# Patient Record
Sex: Male | Born: 2000 | Race: Black or African American | Hispanic: No | Marital: Single | Smoking: Never smoker
Health system: Southern US, Community
[De-identification: ages and names within clinical notes are randomized; demographics above are authoritative.]

## PROBLEM LIST (undated history)

## (undated) DIAGNOSIS — G43909 Migraine, unspecified, not intractable, without status migrainosus: Secondary | ICD-10-CM

## (undated) HISTORY — PX: HAND SURGERY: SHX662

---
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
## 2019-05-09 IMAGING — CT CT MAXILLOFACIAL W/O CM
3 of 6 series · 16 of 47 positions shown, 19 images · non-contrast
Comparison: None.

CLINICAL DATA: The patient ran into a wall today while playing
basketball. Mandibular pain. Initial encounter.

EXAM:
CT MAXILLOFACIAL WITHOUT CONTRAST
TECHNIQUE: Multidetector CT imaging of the maxillofacial structures was
performed. Multiplanar CT image reconstructions were also generated.

[Series 3: maxilllofacial 2.0 hr40 3 · axial · 0.40mm/px · z∈[-259,-97]mm · 11 of 95 slices shown, 14 images]
[im 7/95  brain]
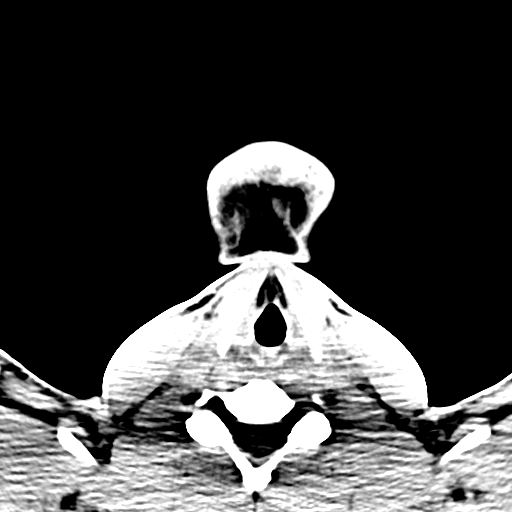
[im 7/95  bone]
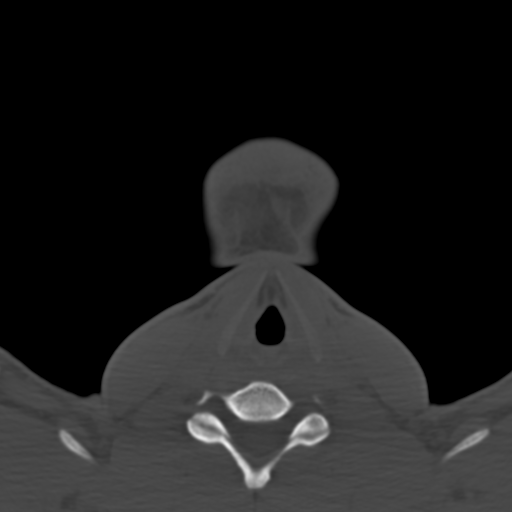
[im 14/95  bone]
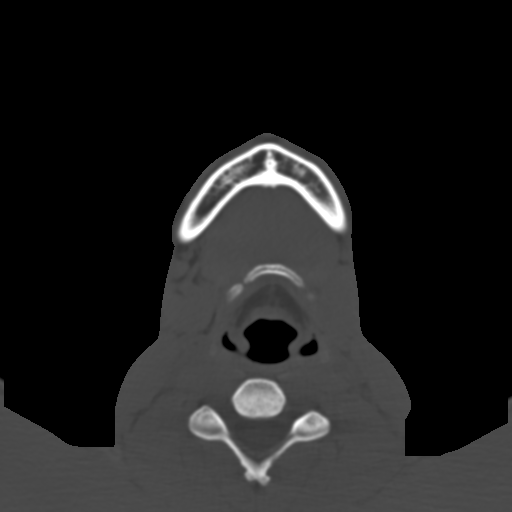
[im 21/95  bone]
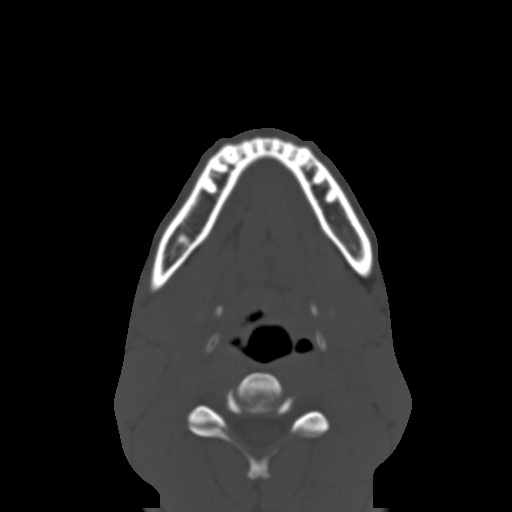
[im 34/95  bone]
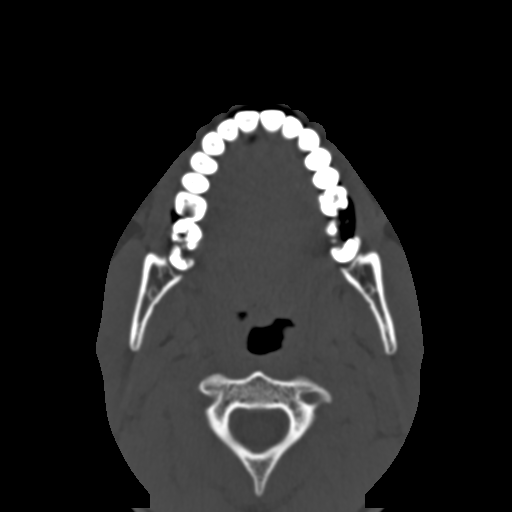
[im 41/95  brain]
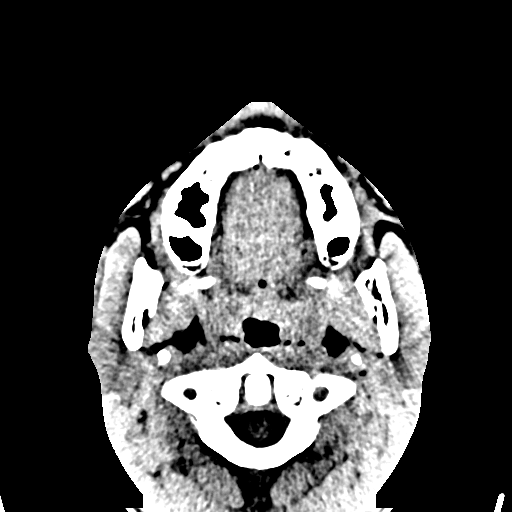
[im 41/95  bone]
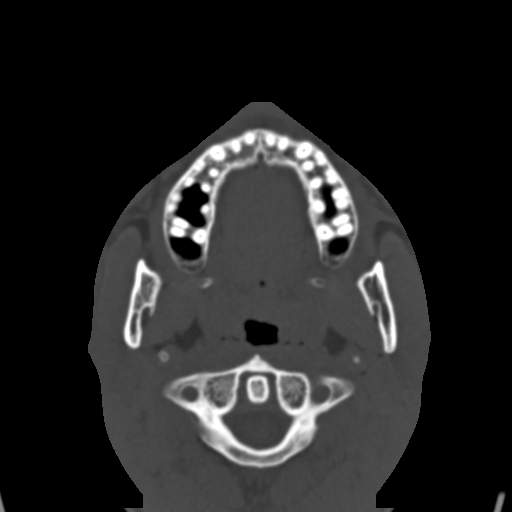
[im 48/95  bone]
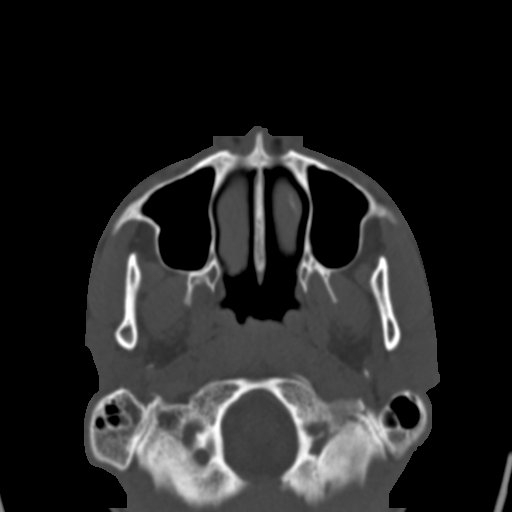
[im 54/95  bone]
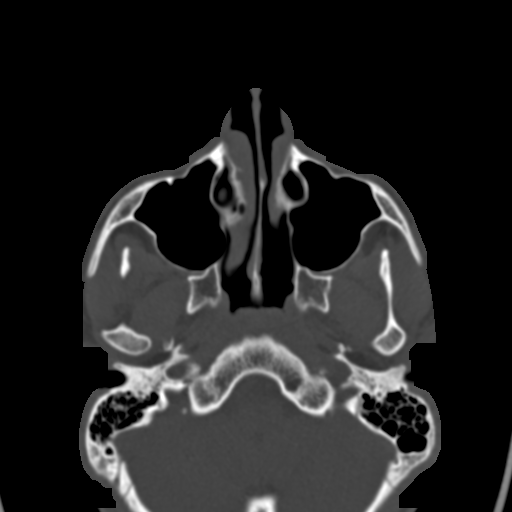
[im 61/95  bone]
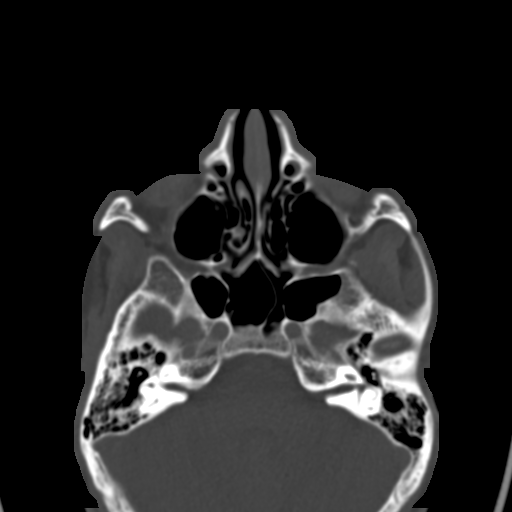
[im 74/95  brain]
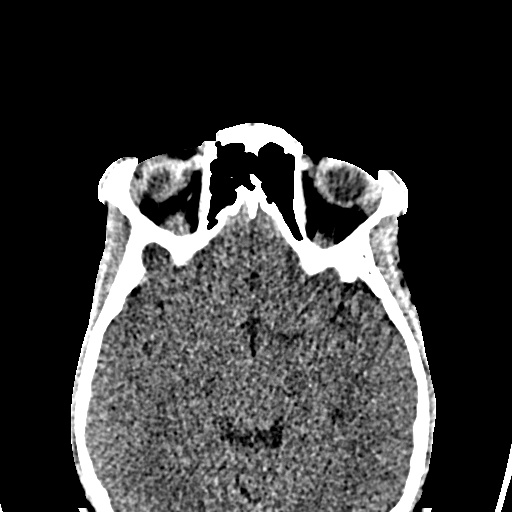
[im 74/95  bone]
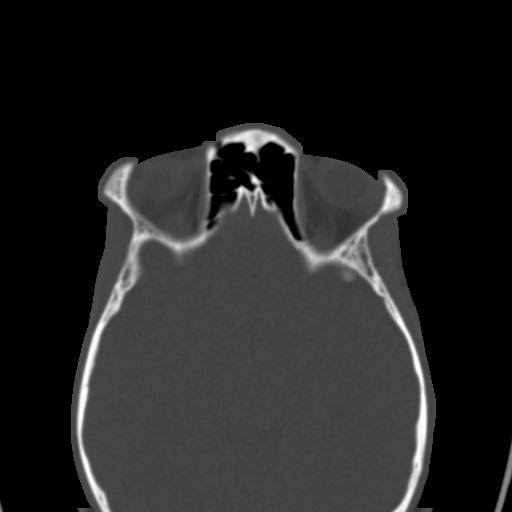
[im 81/95  bone]
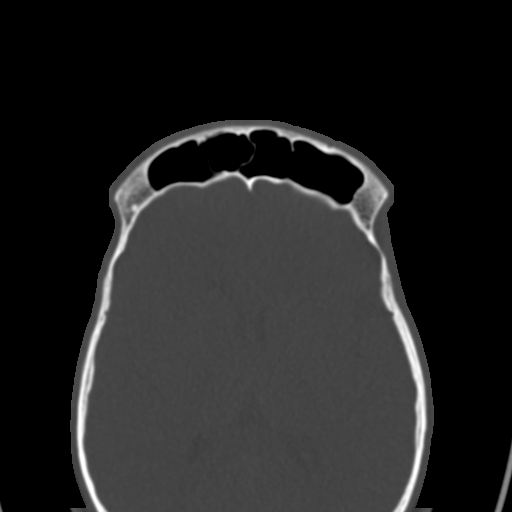
[im 88/95  bone]
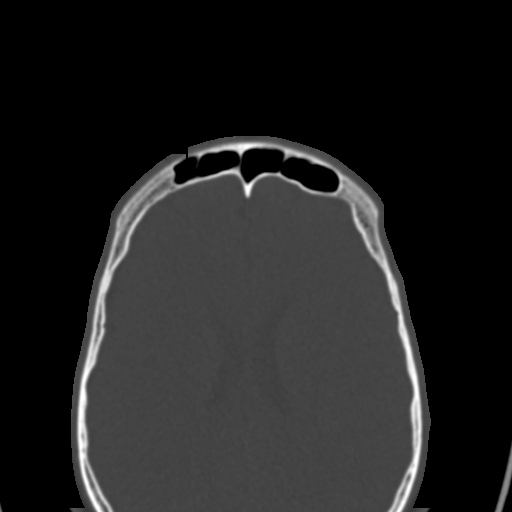

[Series 9: bone cor · coronal · 0.39mm/px · 3 of 87 slices shown]
[im 22/87  bone]
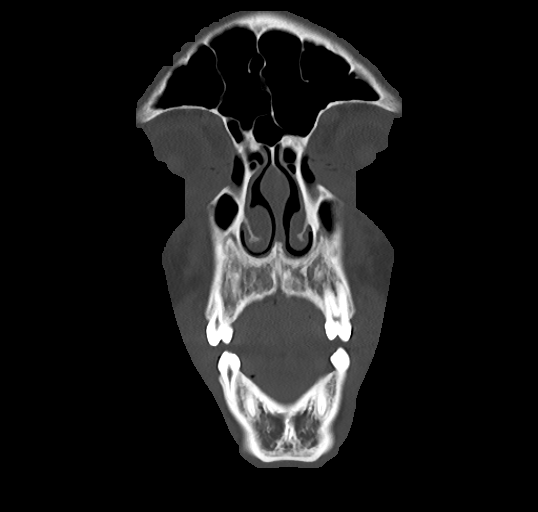
[im 44/87  bone]
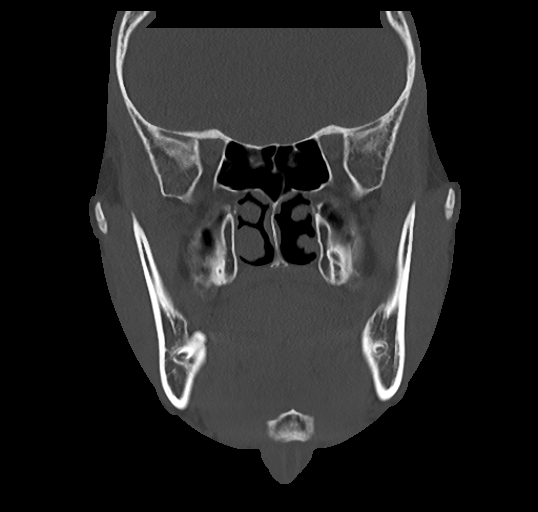
[im 65/87  bone]
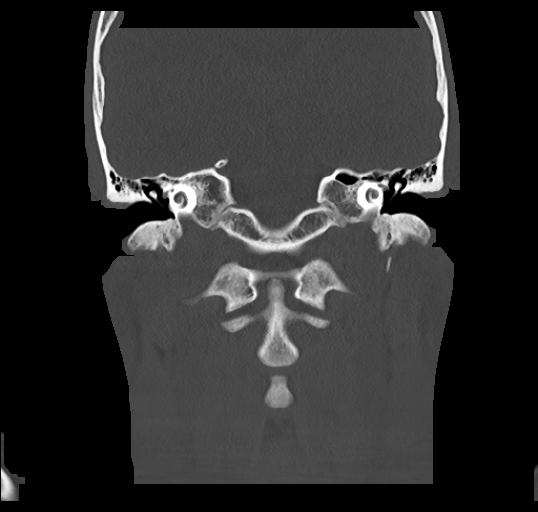

[Series 12: bone sag · sagittal · 0.37mm/px · 2 of 94 slices shown]
[im 32/94  bone]
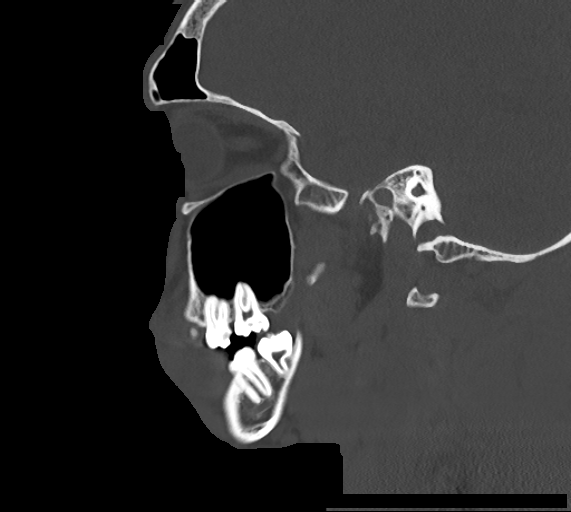
[im 63/94  bone]
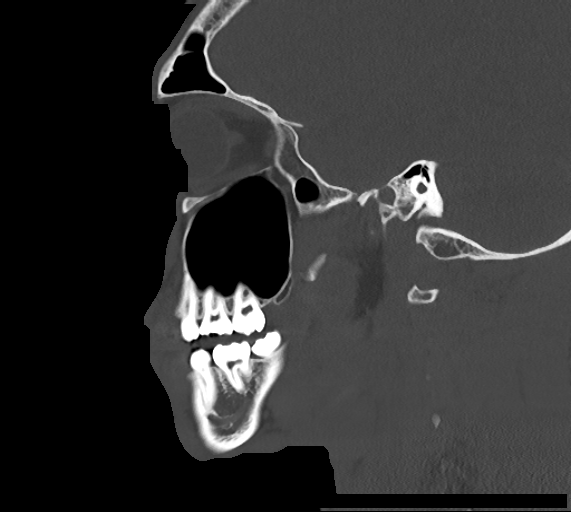

[16 of 47 positions shown; findings below may reference images not displayed]

FINDINGS: Osseous: No fracture or mandibular dislocation. No destructive
process.

Orbits: Negative. No traumatic or inflammatory finding.

Sinuses: Clear.

Soft tissues: Negative.

Limited intracranial: Negative.

Other: A large appearing cavity in an upper left molar is noted. No
evidence of periapical abscess.
IMPRESSION: No acute abnormality.

Dental disease with a cavity and upper left molar.

## 2021-03-30 ENCOUNTER — Other Ambulatory Visit: Payer: Self-pay

## 2021-03-30 ENCOUNTER — Encounter (HOSPITAL_BASED_OUTPATIENT_CLINIC_OR_DEPARTMENT_OTHER): Payer: Self-pay | Admitting: Emergency Medicine

## 2021-03-30 ENCOUNTER — Emergency Department (HOSPITAL_BASED_OUTPATIENT_CLINIC_OR_DEPARTMENT_OTHER)
Admission: EM | Admit: 2021-03-30 | Discharge: 2021-03-30 | Disposition: A | Discharge status: Home / Self Care | Payer: No Typology Code available for payment source | Attending: Emergency Medicine | Admitting: Emergency Medicine

## 2021-03-30 DIAGNOSIS — Z20822 Contact with and (suspected) exposure to covid-19: Secondary | ICD-10-CM | POA: Insufficient documentation

## 2021-03-30 DIAGNOSIS — J029 Acute pharyngitis, unspecified: Secondary | ICD-10-CM | POA: Insufficient documentation

## 2021-03-30 LAB — GROUP A STREP BY PCR: Group A Strep by PCR: NOT DETECTED

## 2021-03-30 LAB — RESP PANEL BY RT-PCR (FLU A&B, COVID) ARPGX2
Influenza A by PCR: NEGATIVE
Influenza B by PCR: NEGATIVE
SARS Coronavirus 2 by RT PCR: NEGATIVE

## 2021-03-30 LAB — MONONUCLEOSIS SCREEN: Mono Screen: NEGATIVE

## 2021-03-30 MED ORDER — DOXYCYCLINE HYCLATE 100 MG PO CAPS: 100.0000 mg | ORAL_CAPSULE | Freq: Two times a day (BID) | ORAL | 0 refills | Status: AC

## 2021-03-30 MED ORDER — DOXYCYCLINE HYCLATE 100 MG PO TABS
100.0000 mg | ORAL_TABLET | Freq: Once | ORAL | Status: AC
Start: 2021-03-30 — End: 2021-03-30
  Administered 2021-03-30: 100 mg via ORAL
  Filled 2021-03-30: qty 1

## 2021-03-30 MED ORDER — LIDOCAINE HCL (PF) 1 % IJ SOLN
INTRAMUSCULAR | Status: AC
  Administered 2021-03-30: 1 mL
  Filled 2021-03-30: qty 5

## 2021-03-30 MED ORDER — DEXAMETHASONE 4 MG PO TABS
10.0000 mg | ORAL_TABLET | Freq: Once | ORAL | Status: AC
  Administered 2021-03-30: 10 mg via ORAL
  Filled 2021-03-30: qty 3

## 2021-03-30 MED ORDER — CEFTRIAXONE SODIUM 500 MG IJ SOLR
500.0000 mg | Freq: Once | INTRAMUSCULAR | Status: AC
  Administered 2021-03-30: 500 mg via INTRAMUSCULAR
  Filled 2021-03-30: qty 500

## 2021-03-30 NOTE — ED Provider Notes (Signed)
[facility] EMERGENCY DEPT Provider Note   CSN: [id number] Arrival date & time: 03/30/21  1311     History Chief Complaint  Patient presents with   Sore Throat    Leonard Gill is a 20 y.o. male.   Sore Throat This is a chronic problem. The current episode started more than 1 week ago (4 weeks ago). The problem occurs constantly. The problem has not changed since onset.Pertinent negatives include no chest pain, no abdominal pain, no headaches and no shortness of breath. Nothing aggravates the symptoms. Nothing relieves the symptoms.   20 year old otherwise healthy male who endorses a sore throat with associated difficulty swallowing for the past month.  He states that he has had a sharp, shooting pain with no radiation and associated throat swelling and difficulty swallowing for the past 4 weeks.  Nothing has made it better or worse.  He denies a history of mono.  He denies any recent sick contacts.  He denies any unilateral throat swelling.  He denies any neck pain or stiffness.  He states that he is currently sexually active and has had oral intercourse with another male in the past month.  Past Medical History:  Diagnosis Date   Migraines     There are no problems to display for this patient.   History reviewed. No pertinent surgical history.     No family history on file.  Social History   Tobacco Use   Smoking status: Never   Smokeless tobacco: Never  Substance Use Topics   Alcohol use: No    Home Medications Prior to Admission medications   Medication Sig Start Date End Date Taking? Authorizing Provider  doxycycline (VIBRAMYCIN) 100 MG capsule Take 1 capsule (100 mg total) by mouth 2 (two) times daily for 7 days. 03/30/21 04/06/21 Yes Ernie Avena, MD    Allergies    Patient has no known allergies.  Review of Systems   Review of Systems  HENT:  Positive for sore throat and trouble swallowing.   Respiratory:  Negative for shortness of  breath.   Cardiovascular:  Negative for chest pain.  Gastrointestinal:  Negative for abdominal pain.  Neurological:  Negative for headaches.  All other systems reviewed and are negative.  Physical Exam Updated Vital Signs BP 129/79 (BP Location: Right Arm)   Pulse (!) 53   Temp 98.8 F (37.1 C) (Oral)   Resp 18   Ht 6\' 5"  (1.956 m)   Wt 81.6 kg   SpO2 97%   BMI 21.34 kg/m   Physical Exam Vitals and nursing note reviewed.  Constitutional:      General: He is not in acute distress. HENT:     Head: Normocephalic and atraumatic.     Nose: No congestion or rhinorrhea.     Mouth/Throat:     Mouth: Mucous membranes are moist. No oral lesions.     Pharynx: Uvula midline. No pharyngeal swelling.     Tonsils: Tonsillar exudate present. No tonsillar abscesses. 1+ on the right. 1+ on the left.     Comments: Oropharyngeal erythema bilaterally with tonsillar exudate present.  No clear asymmetry to suggest PTA.  Good range of motion of the neck  Eyes:     Conjunctiva/sclera: Conjunctivae normal.     Pupils: Pupils are equal, round, and reactive to light.  Cardiovascular:     Rate and Rhythm: Normal rate and regular rhythm.  Pulmonary:     Effort: Pulmonary effort is normal. No respiratory distress.  Abdominal:  General: There is no distension.     Tenderness: There is no guarding.  Musculoskeletal:        General: No deformity or signs of injury.     Cervical back: Normal range of motion and neck supple.  Skin:    Findings: No lesion or rash.  Neurological:     General: No focal deficit present.     Mental Status: He is alert. Mental status is at baseline.    ED Results / Procedures / Treatments   Labs (all labs ordered are listed, but only abnormal results are displayed) Labs Reviewed  GROUP A STREP BY PCR  RESP PANEL BY RT-PCR (FLU A&B, COVID) ARPGX2  MONONUCLEOSIS SCREEN  GC/CHLAMYDIA PROBE AMP () NOT AT [facility]    EKG None  Radiology No results  found.  Procedures Procedures   Medications Ordered in ED Medications  cefTRIAXone (ROCEPHIN) injection 500 mg (has no administration in time range)  doxycycline (VIBRA-TABS) tablet 100 mg (has no administration in time range)  dexamethasone (DECADRON) tablet 10 mg (10 mg Oral Given 03/30/21 1554)    ED Course  I have reviewed the triage vital signs and the nursing notes.  Pertinent labs & imaging results that were available during my care of the patient were reviewed by me and considered in my medical decision making (see chart for details).    MDM Rules/Calculators/A&P                           20 year old male presenting to the emergency department with roughly 4 weeks of sore throat.  on arrival, the patient was afebrile, hemodynamically stable, bilateral oropharyngeal erythema with bilateral tonsillar swelling and exudate without clear evidence of PTA.  Differential diagnosis includes strep throat, infectious mononucleosis, oropharyngeal gonococcal infection, less likely RPA, PTA, Ludwig's angina based on physical exam findings.  The patient had a mononucleosis screen which was negative.  Group A strep by PCR was also not detected.  A COVID-19 and influenza swab was collected and pending.  GC/chlamydia swab of the throat was also collected and pending.  Patient was administered Decadron tablet 10 mg for his oropharyngeal swelling.  Given the duration of the patient's pharyngitis of 1 month, negative mononucleosis screen, recent sexual activity to include oral sex, we will go ahead and treat empirically for GC/chlamydia here in the ED while GC swab is pending.  Patient was administered 500 mg of IM Rocephin and was started on doxycycline twice daily for 7 days.  He was overall tolerating oral intake and deemed stable for discharge.  Final Clinical Impression(s) / ED Diagnoses Final diagnoses:  Pharyngitis, unspecified etiology    Rx / DC Orders ED Discharge Orders           Ordered    doxycycline (VIBRAMYCIN) 100 MG capsule  2 times daily        03/30/21 1632             Ernie Avena, MD 03/30/21 [phone]

## 2021-03-30 NOTE — Discharge Instructions (Signed)
Your COVID-19 and influenza PCR tests are pending and will be available on our patient portal.  Your GC and Chlamydia testing will also be available on our patient portal.  In the meantime, we will empirically treat you for possible gonorrhea/chlamydia of the throat.  You were administered intramuscular antibiotics here and will be started on a short course of doxycycline.

## 2021-03-30 NOTE — ED Triage Notes (Signed)
Reports sore throat with difficulty swallowing for a month.  Also endorses foul breathe.

## 2021-03-31 LAB — GC/CHLAMYDIA PROBE AMP (~~LOC~~) NOT AT ARMC
Chlamydia: NEGATIVE
Comment: NEGATIVE
Comment: NORMAL
Neisseria Gonorrhea: NEGATIVE

## 2021-04-11 ENCOUNTER — Encounter (HOSPITAL_BASED_OUTPATIENT_CLINIC_OR_DEPARTMENT_OTHER): Payer: Self-pay | Admitting: *Deleted

## 2021-04-11 ENCOUNTER — Emergency Department (HOSPITAL_BASED_OUTPATIENT_CLINIC_OR_DEPARTMENT_OTHER)
Admission: EM | Admit: 2021-04-11 | Discharge: 2021-04-11 | Disposition: A | Discharge status: Home / Self Care | Payer: Self-pay | Attending: Emergency Medicine | Admitting: Emergency Medicine

## 2021-04-11 ENCOUNTER — Other Ambulatory Visit: Payer: Self-pay

## 2021-04-11 DIAGNOSIS — M791 Myalgia, unspecified site: Secondary | ICD-10-CM | POA: Insufficient documentation

## 2021-04-11 DIAGNOSIS — A084 Viral intestinal infection, unspecified: Secondary | ICD-10-CM | POA: Insufficient documentation

## 2021-04-11 DIAGNOSIS — Z20822 Contact with and (suspected) exposure to covid-19: Secondary | ICD-10-CM | POA: Insufficient documentation

## 2021-04-11 LAB — COMPREHENSIVE METABOLIC PANEL
ALT: 25 U/L (ref 0–44)
AST: 36 U/L (ref 15–41)
Albumin: 4.7 g/dL (ref 3.5–5.0)
Alkaline Phosphatase: 50 U/L (ref 38–126)
Anion gap: 8 (ref 5–15)
BUN: 14 mg/dL (ref 6–20)
CO2: 29 mmol/L (ref 22–32)
Calcium: 9.5 mg/dL (ref 8.9–10.3)
Chloride: 105 mmol/L (ref 98–111)
Creatinine, Ser: 1.2 mg/dL (ref 0.61–1.24)
GFR, Estimated: >60 mL/min (ref >60)
Glucose, Bld: 114 mg/dL — ABNORMAL HIGH (ref 70–99)
Potassium: 4.1 mmol/L (ref 3.5–5.1)
Sodium: 142 mmol/L (ref 135–145)
Total Bilirubin: 0.6 mg/dL (ref 0.3–1.2)
Total Protein: 7.8 g/dL (ref 6.5–8.1)

## 2021-04-11 LAB — URINALYSIS, ROUTINE W REFLEX MICROSCOPIC
Bilirubin Urine: NEGATIVE
Glucose, UA: NEGATIVE mg/dL
Hgb urine dipstick: NEGATIVE
Ketones, ur: NEGATIVE mg/dL
Leukocytes,Ua: NEGATIVE
Nitrite: NEGATIVE
Protein, ur: 30 mg/dL — AB
Specific Gravity, Urine: 1.034 — ABNORMAL HIGH (ref 1.005–1.030)
pH: 7 (ref 5.0–8.0)

## 2021-04-11 LAB — RESP PANEL BY RT-PCR (FLU A&B, COVID) ARPGX2
Influenza A by PCR: NEGATIVE
Influenza B by PCR: NEGATIVE
SARS Coronavirus 2 by RT PCR: NEGATIVE

## 2021-04-11 LAB — CBC
HCT: 44.9 % (ref 39.0–52.0)
Hemoglobin: 14.7 g/dL (ref 13.0–17.0)
MCH: 28.4 pg (ref 26.0–34.0)
MCHC: 32.7 g/dL (ref 30.0–36.0)
MCV: 86.7 fL (ref 80.0–100.0)
Platelets: 133 10*3/uL — ABNORMAL LOW (ref 150–400)
RBC: 5.18 MIL/uL (ref 4.22–5.81)
RDW: 12.7 % (ref 11.5–15.5)
WBC: 10.6 10*3/uL — ABNORMAL HIGH (ref 4.0–10.5)
nRBC: 0 % (ref 0.0–0.2)

## 2021-04-11 LAB — LIPASE, BLOOD: Lipase: 19 U/L (ref 11–51)

## 2021-04-11 MED ORDER — ONDANSETRON 4 MG PO TBDP
4.0000 mg | ORAL_TABLET | Freq: Once | ORAL | Status: AC | PRN
  Administered 2021-04-11: 4 mg via ORAL
  Filled 2021-04-11: qty 1

## 2021-04-11 MED ORDER — METOCLOPRAMIDE HCL 5 MG/ML IJ SOLN
10.0000 mg | Freq: Once | INTRAMUSCULAR | Status: AC
  Administered 2021-04-11: 10 mg via INTRAVENOUS
  Filled 2021-04-11: qty 2

## 2021-04-11 MED ORDER — ONDANSETRON 4 MG PO TBDP: 4.0000 mg | ORAL_TABLET | Freq: Three times a day (TID) | ORAL | 0 refills | Status: DC | PRN

## 2021-04-11 MED ORDER — SODIUM CHLORIDE 0.9 % IV BOLUS
1000.0000 mL | Freq: Once | INTRAVENOUS | Status: AC
  Administered 2021-04-11: 1000 mL via INTRAVENOUS

## 2021-04-11 MED ORDER — METOCLOPRAMIDE HCL 10 MG PO TABS: 10.0000 mg | ORAL_TABLET | Freq: Four times a day (QID) | ORAL | 0 refills | Status: DC

## 2021-04-11 MED ORDER — ONDANSETRON HCL 4 MG/2ML IJ SOLN
4.0000 mg | Freq: Once | INTRAMUSCULAR | Status: AC
  Administered 2021-04-11: 4 mg via INTRAVENOUS
  Filled 2021-04-11: qty 2

## 2021-04-11 NOTE — ED Triage Notes (Signed)
Pt states he has been vomiting this morning, dry heaves now, reports old looking blood in emesis. Generally just feels bad.

## 2021-04-11 NOTE — ED Notes (Signed)
RT Note: Pt. placed back in lobby with mother per RN request, labels left at Triage.

## 2021-04-11 NOTE — ED Notes (Signed)
Pt provided water, per his request.

## 2021-04-11 NOTE — ED Provider Notes (Signed)
[place] EMERGENCY DEPT Provider Note   CSN: [id number] Arrival date & time: 04/11/21  [id number]     History Chief Complaint  Patient presents with   Emesis   Nausea    Leonard Gill is a 20 y.o. male with no significant past medical history who presents to the emergency department with abdominal pain and vomiting since 6 AM this morning.  Patient states he had several episodes of blood-streaked emesis, nonbloody diarrhea, productive cough, headache, and body aches.  Is also complaining of generalized abdominal pain, no aggravating or alleviating factors.  Mother bedside states that he has been breaking out in cold sweats, but she has not checked his temperature.  No one else at home with similar symptoms.  They have not tried anything for the symptoms.   Emesis Associated symptoms: abdominal pain, chills, cough, diarrhea, headaches and myalgias       Past Medical History:  Diagnosis Date   Migraines     There are no problems to display for this patient.   Past Surgical History:  Procedure Laterality Date   HAND SURGERY Right        No family history on file.  Social History   Tobacco Use   Smoking status: Never   Smokeless tobacco: Never  Vaping Use   Vaping Use: Never used  Substance Use Topics   Alcohol use: No   Drug use: Never    Home Medications Prior to Admission medications   Medication Sig Start Date End Date Taking? Authorizing Provider  metoCLOPramide (REGLAN) 10 MG tablet Take 1 tablet (10 mg total) by mouth every 6 (six) hours. 04/11/21  Yes Modena Bellemare T, PA-C  ondansetron (ZOFRAN ODT) 4 MG disintegrating tablet Take 1 tablet (4 mg total) by mouth every 8 (eight) hours as needed for nausea or vomiting. 04/11/21  Yes Amariyana Heacox T, PA-C    Allergies    Patient has no known allergies.  Review of Systems   Review of Systems  Constitutional:  Positive for chills.  HENT:  Positive for congestion.   Respiratory:  Positive  for cough. Negative for shortness of breath.   Cardiovascular:  Negative for chest pain.  Gastrointestinal:  Positive for abdominal pain, diarrhea, nausea and vomiting. Negative for blood in stool and constipation.  Musculoskeletal:  Positive for myalgias.  Neurological:  Positive for headaches.  All other systems reviewed and are negative.  Physical Exam Updated Vital Signs BP 127/76 (BP Location: Right Arm)   Pulse 87   Temp 98 F (36.7 C) (Oral)   Resp 16   Ht 6\' 5"  (1.956 m)   Wt 81.6 kg   SpO2 100%   BMI 21.34 kg/m   Physical Exam Vitals and nursing note reviewed.  Constitutional:      Appearance: Normal appearance.  HENT:     Head: Normocephalic and atraumatic.  Eyes:     Conjunctiva/sclera: Conjunctivae normal.  Cardiovascular:     Rate and Rhythm: Normal rate and regular rhythm.  Pulmonary:     Effort: Pulmonary effort is normal. No respiratory distress.     Breath sounds: Normal breath sounds.  Abdominal:     General: There is no distension.     Palpations: Abdomen is soft.     Tenderness: There is no abdominal tenderness.  Skin:    General: Skin is warm and dry.  Neurological:     General: No focal deficit present.     Mental Status: He is alert.    ED  Results / Procedures / Treatments   Labs (all labs ordered are listed, but only abnormal results are displayed) Labs Reviewed  COMPREHENSIVE METABOLIC PANEL - Abnormal; Notable for the following components:      Result Value   Glucose, Bld 114 (*)    All other components within normal limits  CBC - Abnormal; Notable for the following components:   WBC 10.6 (*)    Platelets 133 (*)    All other components within normal limits  URINALYSIS, ROUTINE W REFLEX MICROSCOPIC - Abnormal; Notable for the following components:   Specific Gravity, Urine 1.034 (*)    Protein, ur 30 (*)    All other components within normal limits  RESP PANEL BY RT-PCR (FLU A&B, COVID) ARPGX2  LIPASE, BLOOD     EKG None  Radiology No results found.  Procedures Procedures   Medications Ordered in ED Medications  ondansetron (ZOFRAN-ODT) disintegrating tablet 4 mg (4 mg Oral Given 04/11/21 1018)  sodium chloride 0.9 % bolus 1,000 mL (0 mLs Intravenous Stopped 04/11/21 1339)  ondansetron (ZOFRAN) injection 4 mg (4 mg Intravenous Given 04/11/21 1417)  metoCLOPramide (REGLAN) injection 10 mg (10 mg Intravenous Given 04/11/21 1508)    ED Course  I have reviewed the triage vital signs and the nursing notes.  Pertinent labs & imaging results that were available during my care of the patient were reviewed by me and considered in my medical decision making (see chart for details).    MDM Rules/Calculators/A&P                           Patient is a 20 year old male who presents to the emergency department for 1 day of abdominal pain and vomiting.  On exam patient is afebrile, not tachycardic, and in no acute distress.  Abdomen soft, nontender nondistended.  CBC and CMP unremarkable.  Lipase normal.  COVID and flu testing negative.  Urinalysis negative for hematuria or urinary tract infection.  Patient stated nausea initially improved with Zofran tablet in triage.  On reevaluation patient states that his symptoms improved after fluids, Zofran, and Reglan.  Passed PO challenge. Abdominal exam remains nonsurgical. Symptoms likely related to viral illness, plan to treat symptomatically.  He does not require admission or inpatient treatment for symptoms at this time.  Discussed reasons return emergency department.  Patient and mother agreeable to plan.  Final Clinical Impression(s) / ED Diagnoses Final diagnoses:  Viral gastroenteritis    Rx / DC Orders ED Discharge Orders          Ordered    ondansetron (ZOFRAN ODT) 4 MG disintegrating tablet  Every 8 hours PRN        04/11/21 1621    metoCLOPramide (REGLAN) 10 MG tablet  Every 6 hours        04/11/21 1621             Jalaiyah Throgmorton,  Rosabella Edgin T, PA-C 04/11/21 1624    Pricilla Loveless, MD 04/12/21 [phone]

## 2021-04-11 NOTE — ED Notes (Signed)
RT Note: Blood work drawn/labelled/sent to [facility].

## 2021-04-11 NOTE — ED Notes (Signed)
Pt reports increased feeling of nausea when standing to walk to bathroom, reports feeling ok when lying in bed. EDP Lorin made aware.

## 2021-04-11 NOTE — Discharge Instructions (Addendum)
You were seen in the emergency department today for abdominal pain and vomiting.  As we discussed I think your symptoms are mostly related to a viral GI infection.  This is normally treated symptomatically with a bland diet, lots of fluids, and over-the-counter medications.  I am prescribing you 2 different nausea medicines, both of which you received in the emergency department.  You can try either one of them to see which you get the best relief with. I've also attached instructions for a bland diet which you should follow for the next several days.  Please use Tylenol or ibuprofen for pain.  You may use 600 mg ibuprofen every 6 hours or 1000 mg of Tylenol every 6 hours.  You may choose to alternate between the 2.  This would be most effective.  Not to exceed 4 g of Tylenol within 24 hours.  Not to exceed 3200 mg ibuprofen in 24 hours.   Continue to monitor how you're doing and return to the ER for new or worsening symptoms such as worsening abdominal pain or fevers.   It has been a pleasure seeing and caring for you today and I hope you start feeling better soon!

## 2022-08-31 ENCOUNTER — Emergency Department (HOSPITAL_COMMUNITY)
Admission: EM | Admit: 2022-08-31 | Discharge: 2022-08-31 | Disposition: A | Discharge status: Home / Self Care | Payer: Medicaid Other | Attending: Emergency Medicine | Admitting: Emergency Medicine

## 2022-08-31 ENCOUNTER — Other Ambulatory Visit: Payer: Self-pay

## 2022-08-31 DIAGNOSIS — J029 Acute pharyngitis, unspecified: Secondary | ICD-10-CM | POA: Diagnosis present

## 2022-08-31 DIAGNOSIS — Z20822 Contact with and (suspected) exposure to covid-19: Secondary | ICD-10-CM | POA: Diagnosis not present

## 2022-08-31 DIAGNOSIS — K029 Dental caries, unspecified: Secondary | ICD-10-CM | POA: Diagnosis not present

## 2022-08-31 LAB — RESP PANEL BY RT-PCR (RSV, FLU A&B, COVID)  RVPGX2
Influenza A by PCR: NEGATIVE
Influenza B by PCR: NEGATIVE
Resp Syncytial Virus by PCR: NEGATIVE
SARS Coronavirus 2 by RT PCR: NEGATIVE

## 2022-08-31 MED ORDER — DEXAMETHASONE 4 MG PO TABS
10.0000 mg | ORAL_TABLET | Freq: Once | ORAL | Status: AC
  Administered 2022-08-31: 10 mg via ORAL
  Filled 2022-08-31: qty 3

## 2022-08-31 NOTE — ED Triage Notes (Signed)
Pt reports ongoing right lower dental pain but new sore throat x 2 days.

## 2022-08-31 NOTE — Discharge Instructions (Signed)
Follow-up with dentist.  I have prescribed you a long-acting steroid to help with your discomfort.  If your strep test is positive I will call in an antibiotic to your pharmacy.

## 2022-08-31 NOTE — ED Provider Notes (Signed)
[facility] Provider Note   CSN: [id number] Arrival date & time: 08/31/22  [id number]     History  Chief Complaint  Patient presents with   Dental Pain   Sore Throat    Leonard Gill is a 22 y.o. male.  Patient here primarily for sore throat that started yesterday.  Denies any fevers or chills.  No difficulty eating or drinking.  Denies any nausea vomiting diarrhea.  He states that he is also concerned about some chipped teeth that he had for a long time.  The history is provided by the patient.       Home Medications Prior to Admission medications   Medication Sig Start Date End Date Taking? Authorizing Provider  metoCLOPramide (REGLAN) 10 MG tablet Take 1 tablet (10 mg total) by mouth every 6 (six) hours. 04/11/21   Roemhildt, Lorin T, PA-C  ondansetron (ZOFRAN ODT) 4 MG disintegrating tablet Take 1 tablet (4 mg total) by mouth every 8 (eight) hours as needed for nausea or vomiting. 04/11/21   Roemhildt, Lorin T, PA-C      Allergies    Patient has no known allergies.    Review of Systems   Review of Systems  Physical Exam Updated Vital Signs BP (!) 129/91 (BP Location: Right Arm)   Pulse 65   Temp 98.2 F (36.8 C)   Resp 16   SpO2 98%  Physical Exam Vitals and nursing note reviewed.  Constitutional:      General: He is not in acute distress.    Appearance: He is well-developed. He is not ill-appearing.  HENT:     Head: Normocephalic and atraumatic.     Comments: Some dental caries on exam but no abscess or swelling or redness    Mouth/Throat:     Mouth: Mucous membranes are moist.     Pharynx: Posterior oropharyngeal erythema present. No pharyngeal swelling or oropharyngeal exudate.     Tonsils: No tonsillar exudate or tonsillar abscesses.  Eyes:     Conjunctiva/sclera: Conjunctivae normal.  Cardiovascular:     Rate and Rhythm: Normal rate and regular rhythm.     Heart sounds: No murmur heard. Pulmonary:     Effort:  Pulmonary effort is normal. No respiratory distress.     Breath sounds: Normal breath sounds.  Abdominal:     Palpations: Abdomen is soft.     Tenderness: There is no abdominal tenderness.  Musculoskeletal:        General: No swelling.     Cervical back: Neck supple.  Skin:    General: Skin is warm and dry.     Capillary Refill: Capillary refill takes less than 2 seconds.  Neurological:     Mental Status: He is alert.  Psychiatric:        Mood and Affect: Mood normal.     ED Results / Procedures / Treatments   Labs (all labs ordered are listed, but only abnormal results are displayed) Labs Reviewed  GROUP A STREP BY PCR  RESP PANEL BY RT-PCR (RSV, FLU A&B, COVID)  RVPGX2    EKG None  Radiology No results found.  Procedures Procedures    Medications Ordered in ED Medications  dexamethasone (DECADRON) tablet 10 mg (has no administration in time range)    ED Course/ Medical Decision Making/ A&P  Medical Decision Making Risk Prescription drug management.   Leonard Gill is here with sore throat.  Normal vitals.  No fever.  No signs of abscess on exam.  Little bit of redness to the throat.  Differential diagnosis allergy related process versus strep pharyngitis versus viral process.  Will test for strep and COVID.  Will treat with Decadron.  Will call in antibiotic if strep test is positive.  He has no trismus or drooling overall unremarkable exam.  He is also wanting some referral to dentist as he has some dental issues with some dental caries on exam.  I do not see any signs of infection in the mouth.  Gave him resources for dentist.  Discharged in good condition.  This chart was dictated using voice recognition software.  Despite best efforts to proofread,  errors can occur which can change the documentation meaning.         Final Clinical Impression(s) / ED Diagnoses Final diagnoses:  Sore throat    Rx / DC Orders ED Discharge  Orders     None         Lennice Sites, DO 08/31/22 1035

## 2023-12-21 ENCOUNTER — Emergency Department (HOSPITAL_COMMUNITY)
Admission: EM | Admit: 2023-12-21 | Discharge: 2023-12-21 | Disposition: A | Discharge status: Home / Self Care | Attending: Emergency Medicine | Admitting: Emergency Medicine

## 2023-12-21 ENCOUNTER — Other Ambulatory Visit: Payer: Self-pay

## 2023-12-21 ENCOUNTER — Encounter (HOSPITAL_COMMUNITY): Payer: Self-pay

## 2023-12-21 DIAGNOSIS — R251 Tremor, unspecified: Secondary | ICD-10-CM | POA: Insufficient documentation

## 2023-12-21 DIAGNOSIS — M62838 Other muscle spasm: Secondary | ICD-10-CM

## 2023-12-21 DIAGNOSIS — E860 Dehydration: Secondary | ICD-10-CM | POA: Insufficient documentation

## 2023-12-21 LAB — CBC WITH DIFFERENTIAL/PLATELET
Abs Immature Granulocytes: 0 K/uL (ref 0.00–0.07)
Basophils Absolute: 0 K/uL (ref 0.0–0.1)
Basophils Relative: 1 %
Eosinophils Absolute: 0 K/uL (ref 0.0–0.5)
Eosinophils Relative: 1 %
HCT: 40.7 % (ref 39.0–52.0)
Hemoglobin: 13.3 g/dL (ref 13.0–17.0)
Lymphocytes Relative: 13 %
Lymphs Abs: 0.5 K/uL — ABNORMAL LOW (ref 0.7–4.0)
MCH: 28.9 pg (ref 26.0–34.0)
MCHC: 32.7 g/dL (ref 30.0–36.0)
MCV: 88.3 fL (ref 80.0–100.0)
Monocytes Absolute: 0.2 K/uL (ref 0.1–1.0)
Monocytes Relative: 5 %
Neutro Abs: 3.2 K/uL (ref 1.7–7.7)
Neutrophils Relative %: 80 %
Platelets: 106 K/uL — ABNORMAL LOW (ref 150–400)
RBC: 4.61 MIL/uL (ref 4.22–5.81)
RDW: 12.3 % (ref 11.5–15.5)
WBC: 4 K/uL (ref 4.0–10.5)
nRBC: 0 % (ref 0.0–0.2)
nRBC: 0 /100{WBCs}

## 2023-12-21 LAB — COMPREHENSIVE METABOLIC PANEL WITH GFR
ALT: 17 U/L (ref 0–44)
AST: 18 U/L (ref 15–41)
Albumin: 3.6 g/dL (ref 3.5–5.0)
Alkaline Phosphatase: 75 U/L (ref 38–126)
Anion gap: 9 (ref 5–15)
BUN: 13 mg/dL (ref 6–20)
CO2: 23 mmol/L (ref 22–32)
Calcium: 9.1 mg/dL (ref 8.9–10.3)
Chloride: 105 mmol/L (ref 98–111)
Creatinine, Ser: 1.07 mg/dL (ref 0.61–1.24)
GFR, Estimated: >60 mL/min (ref >60)
Glucose, Bld: 110 mg/dL — ABNORMAL HIGH (ref 70–99)
Potassium: 4.1 mmol/L (ref 3.5–5.1)
Sodium: 137 mmol/L (ref 135–145)
Total Bilirubin: 0.5 mg/dL (ref 0.0–1.2)
Total Protein: 6.2 g/dL — ABNORMAL LOW (ref 6.5–8.1)

## 2023-12-21 LAB — URINALYSIS, W/ REFLEX TO CULTURE (INFECTION SUSPECTED)
Bilirubin Urine: NEGATIVE
Glucose, UA: NEGATIVE mg/dL
Hgb urine dipstick: NEGATIVE
Ketones, ur: NEGATIVE mg/dL
Leukocytes,Ua: NEGATIVE
Nitrite: NEGATIVE
Protein, ur: NEGATIVE mg/dL
Specific Gravity, Urine: 1.026 (ref 1.005–1.030)
pH: 5 (ref 5.0–8.0)

## 2023-12-21 LAB — MAGNESIUM: Magnesium: 1.8 mg/dL (ref 1.7–2.4)

## 2023-12-21 LAB — TSH: TSH: 1.034 u[IU]/mL (ref 0.350–4.500)

## 2023-12-21 LAB — CK: Total CK: 136 U/L (ref 49–397)

## 2023-12-21 MED ORDER — CYCLOBENZAPRINE HCL 10 MG PO TABS: 10.0000 mg | ORAL_TABLET | Freq: Two times a day (BID) | ORAL | 0 refills | Status: AC | PRN

## 2023-12-21 MED ORDER — SODIUM CHLORIDE 0.9 % IV BOLUS
1000.0000 mL | Freq: Once | INTRAVENOUS | Status: AC
  Administered 2023-12-21: 1000 mL via INTRAVENOUS

## 2023-12-21 NOTE — ED Provider Notes (Signed)
 [place] EMERGENCY DEPARTMENT AT [facility] Provider Note   CSN: [id number] Arrival date & time: 12/21/23  [id number]     Patient presents with: No chief complaint on file.   Leonard Gill is a 23 y.o. male.   The history is provided by the patient, medical records and a relative. No language interpreter was used.  Illness Location:  Intermittent shaking and muscle twitching Severity:  Moderate Onset quality:  Gradual Duration: 4 years. Timing:  Sporadic Progression:  Waxing and waning Chronicity:  Chronic Associated symptoms: no abdominal pain, no chest pain, no congestion, no cough, no diarrhea, no fatigue, no fever, no loss of consciousness, no nausea, no rash, no shortness of breath and no vomiting        Prior to Admission medications   Medication Sig Start Date End Date Taking? Authorizing Provider  metoCLOPramide  (REGLAN ) 10 MG tablet Take 1 tablet (10 mg total) by mouth every 6 (six) hours. 04/11/21   Roemhildt, Lorin T, PA-C  ondansetron  (ZOFRAN  ODT) 4 MG disintegrating tablet Take 1 tablet (4 mg total) by mouth every 8 (eight) hours as needed for nausea or vomiting. 04/11/21   Roemhildt, Lorin T, PA-C    Allergies: Patient has no known allergies.    Review of Systems  Constitutional:  Negative for chills, fatigue and fever.  HENT:  Negative for congestion.   Eyes:  Negative for visual disturbance.  Respiratory:  Negative for cough and shortness of breath.   Cardiovascular:  Negative for chest pain.  Gastrointestinal:  Negative for abdominal pain, constipation, diarrhea, nausea and vomiting.  Genitourinary:  Negative for dysuria.  Musculoskeletal:  Negative for back pain, neck pain and neck stiffness.  Skin:  Negative for rash and wound.  Neurological:  Negative for dizziness, loss of consciousness, syncope, weakness, light-headedness and numbness.  Psychiatric/Behavioral:  Negative for agitation and confusion.   All other systems reviewed and are  negative.   Updated Vital Signs BP (!) 117/91   Pulse 71   Temp 98.8 F (37.1 C) (Oral)   Resp 18   Ht 6' 5 (1.956 m)   Wt 90.7 kg   SpO2 100%   BMI 23.72 kg/m   Physical Exam Vitals and nursing note reviewed.  Constitutional:      General: He is not in acute distress.    Appearance: He is well-developed. He is not ill-appearing, toxic-appearing or diaphoretic.  HENT:     Head: Normocephalic and atraumatic.     Nose: No congestion or rhinorrhea.     Mouth/Throat:     Mouth: Mucous membranes are dry.     Pharynx: No oropharyngeal exudate or posterior oropharyngeal erythema.  Eyes:     Extraocular Movements: Extraocular movements intact.     Conjunctiva/sclera: Conjunctivae normal.     Pupils: Pupils are equal, round, and reactive to light.  Cardiovascular:     Rate and Rhythm: Normal rate and regular rhythm.     Heart sounds: No murmur heard. Pulmonary:     Effort: Pulmonary effort is normal. No respiratory distress.     Breath sounds: Normal breath sounds. No wheezing, rhonchi or rales.  Chest:     Chest wall: No tenderness.  Abdominal:     Palpations: Abdomen is soft.     Tenderness: There is no abdominal tenderness. There is no right CVA tenderness, left CVA tenderness, guarding or rebound.  Musculoskeletal:        General: No swelling or tenderness.     Cervical back:  Neck supple. No tenderness.     Right lower leg: No edema.     Left lower leg: No edema.  Skin:    General: Skin is warm and dry.     Capillary Refill: Capillary refill takes less than 2 seconds.     Findings: No erythema or rash.  Neurological:     General: No focal deficit present.     Mental Status: He is alert.     Sensory: No sensory deficit.     Motor: No weakness.  Psychiatric:        Mood and Affect: Mood normal.     (all labs ordered are listed, but only abnormal results are displayed) Labs Reviewed  CBC WITH DIFFERENTIAL/PLATELET - Abnormal; Notable for the following  components:      Result Value   Platelets 106 (*)    Lymphs Abs 0.5 (*)    All other components within normal limits  COMPREHENSIVE METABOLIC PANEL WITH GFR - Abnormal; Notable for the following components:   Glucose, Bld 110 (*)    Total Protein 6.2 (*)    All other components within normal limits  URINALYSIS, W/ REFLEX TO CULTURE (INFECTION SUSPECTED) - Abnormal; Notable for the following components:   Bacteria, UA RARE (*)    All other components within normal limits  CK  TSH  MAGNESIUM    EKG: None  Radiology: No results found.   Procedures   Medications Ordered in the ED  sodium chloride  0.9 % bolus 1,000 mL (0 mLs Intravenous Stopped 12/21/23 1026)                                    Medical Decision Making Amount and/or Complexity of Data Reviewed Labs: ordered.    Leonard Gill is a 23 y.o. male with a past medical history significant for migraines who presents with shaking episodes.  According to patient, for the last 4 years he had episodes of shaking on and off sometimes in the mornings when he wakes up and sometimes after exercise he reports.  He says that he has had shaking his legs but this morning it lasted a few minutes and was diffuse body shaking.  He remembers everything and said it does not seem to be seizure-like.  No altered mental status or confusion reported.  He denies any fevers or chills and denies any nausea, vomiting, constipation, diarrhea, or urinary changes.  Denies any new supplements or preworkout medications and he does exercise some.  He reports no focal pain at this time just the muscle twitching and spasming at times.  He reports no dysuria or hematuria and no other reports of other history.  On exam, lungs clear.  Chest nontender.  Abdomen nontender.  Patient moving all extremities.  No focal neurologic deficits on my exam.  Back and flanks nontender.  Neck nontender.  We had a shared decision conversation and agreed to check some labs,  give him some fluids as he did have dry mucous membranes, and get a urinalysis.  We want to make sure he does not have rhabdo, some significant electrolyte disturbance, or dehydration.  I suspect he is having some muscle twitching and spasms causing some of his symptoms.  Anticipate reassessment after workup.  Patient reports feeling much better after some fluids.  His workup was grossly reassuring.  CBC and CMP did not show critical abnormalities and CK is normal.  No  rhabdo.  TSH normal.  Urinalysis does not show UTI.  With all the findings together and I suspect he was having some dehydration and some muscle spasm and shaking.  Patient is amenable to following with the PCP and they requested the number for neurologist to follow-up with given family history of seizures.  I am not convinced patient is having seizures but will give him neurology number to call.  Will also give prescription for a muscle relaxant if he is having muscle spasms that is bothering him.  He agreed with plan of care and family agreed as well.  Patient discharged in good condition with improved symptoms after rehydration.      Final diagnoses:  Muscle spasm  Episode of shaking  Dehydration    ED Discharge Orders          Ordered    cyclobenzaprine  (FLEXERIL ) 10 MG tablet  2 times daily PRN        12/21/23 1240            Clinical Impression: 1. Muscle spasm   2. Episode of shaking   3. Dehydration     Disposition: Discharge  Condition: Good  I have discussed the results, Dx and Tx plan with the pt(& family if present). He/she/they expressed understanding and agree(s) with the plan. Discharge instructions discussed at great length. Strict return precautions discussed and pt &/or family have verbalized understanding of the instructions. No further questions at time of discharge.    Discharge Medication List as of 12/21/2023 12:41 PM     START taking these medications   Details  cyclobenzaprine   (FLEXERIL ) 10 MG tablet Take 1 tablet (10 mg total) by mouth 2 (two) times daily as needed for muscle spasms., Starting Sat 12/21/2023, Print        Follow Up: [facility] [address] [place] [address] [place]  [address] [phone] Schedule an appointment as soon as possible for a visit    [facility] [address] [place]  [address] [phone]         Bodey Frizell, Lonni PARAS, MD 12/21/23 1254

## 2023-12-21 NOTE — ED Triage Notes (Signed)
 Pt bib ems for bilateral lower legs shaking, pt reports his whole body was shaking. Pt has hx of these episodes but unsure if they are seizures. Pt denies hx of seizures. Pt also c.o right shoulder pain, states he plays basketball a lot and works at an assisted living facility. Pt a.o, denies any other associated symptoms.

## 2023-12-21 NOTE — Discharge Instructions (Signed)
 Your history, exam, workup today seem consistent with muscle spasm and shaking in the setting of dehydration.  The rest of your workup was overall reassuring and your symptoms and evaluation did not seem consistent with acute seizure today.  However given the family history it is not unreasonable to follow-up with outpatient neurology and a primary doctor.  Please rest and stay hydrated and continue taking the medications if you are having muscle spasm and soreness.  If any symptoms change or worsen acutely, please return to the nearest Emergency Department.
